# Patient Record
Sex: Male | Born: 1955 | Race: Black or African American | Hispanic: No | Marital: Married | Smoking: Current every day smoker
Health system: Southern US, Community
[De-identification: ages and names within clinical notes are randomized; demographics above are authoritative.]

## PROBLEM LIST (undated history)

## (undated) DIAGNOSIS — I10 Essential (primary) hypertension: Secondary | ICD-10-CM

## (undated) DIAGNOSIS — F1010 Alcohol abuse, uncomplicated: Secondary | ICD-10-CM

## (undated) HISTORY — DX: Essential (primary) hypertension: I10

---

## 2001-05-27 ENCOUNTER — Emergency Department (HOSPITAL_COMMUNITY): Admission: EM | Admit: 2001-05-27 | Discharge: 2001-05-27 | Payer: Self-pay | Admitting: Emergency Medicine

## 2001-05-27 ENCOUNTER — Encounter: Payer: Self-pay | Admitting: *Deleted

## 2006-04-01 ENCOUNTER — Emergency Department (HOSPITAL_COMMUNITY): Admission: EM | Admit: 2006-04-01 | Discharge: 2006-04-01 | Payer: Self-pay | Admitting: Emergency Medicine

## 2008-09-21 IMAGING — CR DG CHEST 2V
2 series · 2 of 2 positions shown · non-contrast
Comparison: none

CLINICAL DATA: Shortness of breath, and cough.
 CHEST - 2 VIEW ? 04/01/2006:

[w chest pa]
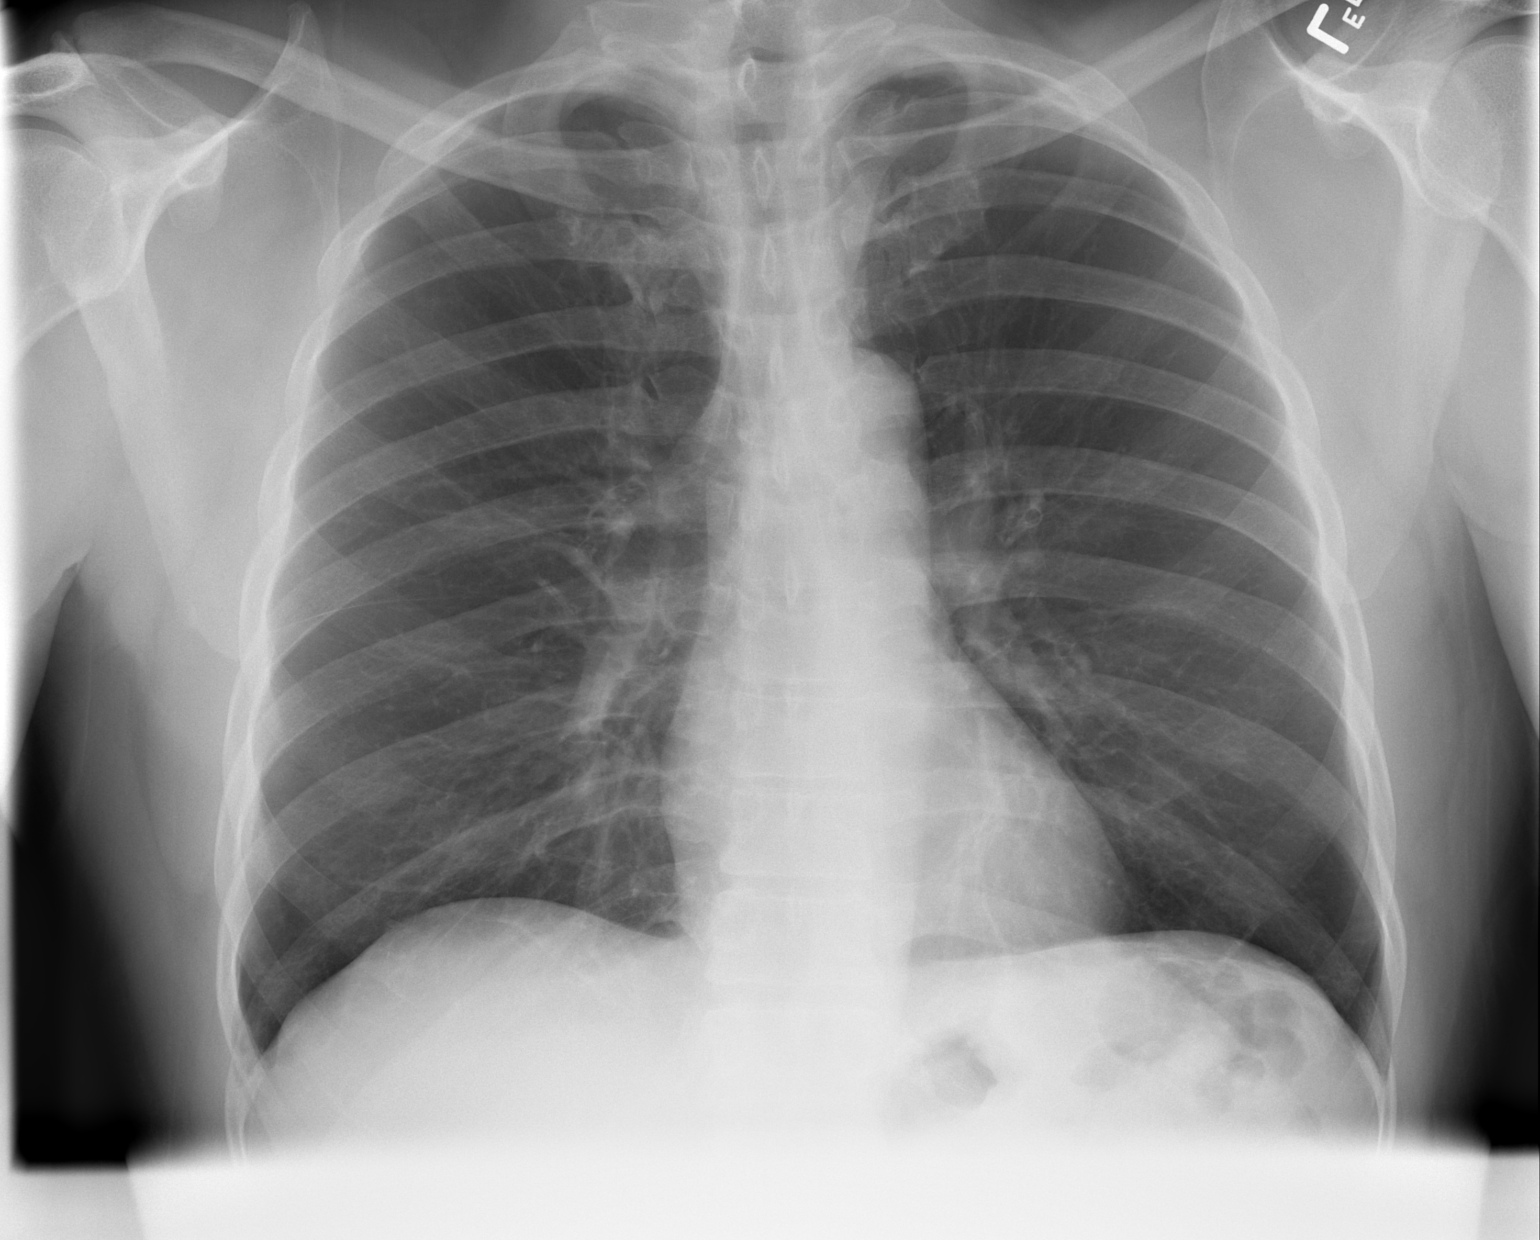

[w chest lat]
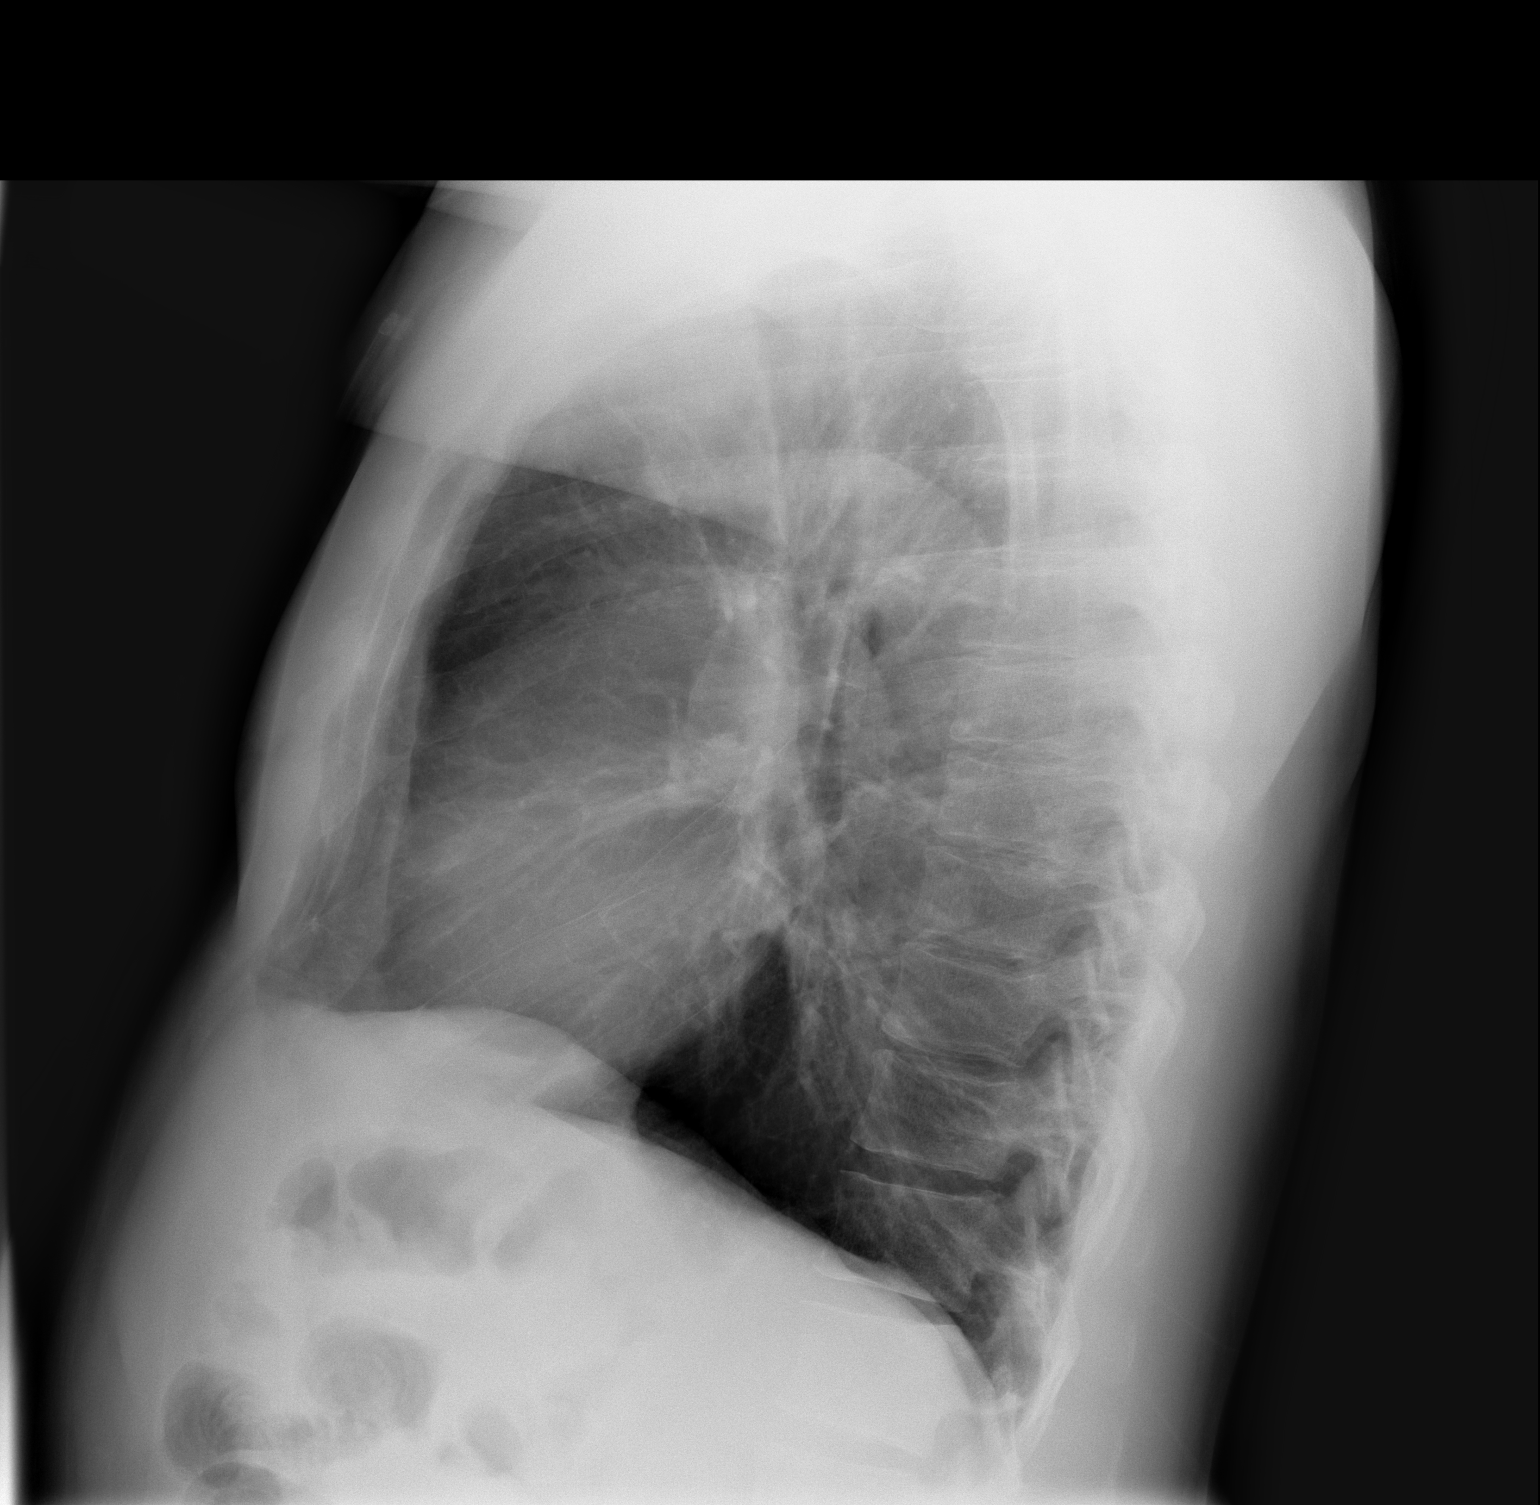

[2 of 2 positions shown; findings below may reference images not displayed]

FINDINGS: Lungs appear somewhat hyperaerated, which may be due to air trapping.  No infiltrate, consolidation or atelectasis.  Possible minimal scarring in the lingular segment.
IMPRESSION: Negative for pneumonia.

## 2009-06-27 ENCOUNTER — Inpatient Hospital Stay (HOSPITAL_COMMUNITY): Admission: EM | Admit: 2009-06-27 | Discharge: 2009-06-28 | Payer: Self-pay | Admitting: Emergency Medicine

## 2010-05-12 LAB — DIFFERENTIAL
Eosinophils Absolute: 0.1 10*3/uL (ref 0.0–0.7)
Eosinophils Relative: 1 % (ref 0–5)
Lymphocytes Relative: 10 % — ABNORMAL LOW (ref 12–46)
Lymphs Abs: 1.2 10*3/uL (ref 0.7–4.0)
Lymphs Abs: 1.4 10*3/uL (ref 0.7–4.0)
Monocytes Absolute: 0.9 10*3/uL (ref 0.1–1.0)
Monocytes Relative: 12 % (ref 3–12)
Monocytes Relative: 6 % (ref 3–12)
Neutro Abs: 11.2 10*3/uL — ABNORMAL HIGH (ref 1.7–7.7)

## 2010-05-12 LAB — CARDIAC PANEL(CRET KIN+CKTOT+MB+TROPI)
Total CK: 115 U/L (ref 7–232)
Troponin I: 0.01 ng/mL (ref 0.00–0.06)
Troponin I: 0.02 ng/mL (ref 0.00–0.06)

## 2010-05-12 LAB — URINALYSIS, ROUTINE W REFLEX MICROSCOPIC
Bilirubin Urine: NEGATIVE
Glucose, UA: NEGATIVE mg/dL
Nitrite: NEGATIVE
Specific Gravity, Urine: 1.01 (ref 1.005–1.030)
pH: 5.5 (ref 5.0–8.0)

## 2010-05-12 LAB — CBC
HCT: 37.4 % — ABNORMAL LOW (ref 39.0–52.0)
Hemoglobin: 12.9 g/dL — ABNORMAL LOW (ref 13.0–17.0)
MCHC: 34.6 g/dL (ref 30.0–36.0)
MCHC: 35.1 g/dL (ref 30.0–36.0)
MCV: 91.9 fL (ref 78.0–100.0)
Platelets: 249 10*3/uL (ref 150–400)
Platelets: 267 10*3/uL (ref 150–400)
RBC: 4.07 MIL/uL — ABNORMAL LOW (ref 4.22–5.81)
RDW: 13.4 % (ref 11.5–15.5)
RDW: 13.9 % (ref 11.5–15.5)
WBC: 13.5 10*3/uL — ABNORMAL HIGH (ref 4.0–10.5)

## 2010-05-12 LAB — CULTURE, BLOOD (ROUTINE X 2)

## 2010-05-12 LAB — BASIC METABOLIC PANEL
BUN: 11 mg/dL (ref 6–23)
CO2: 21 mEq/L (ref 19–32)
Calcium: 8.8 mg/dL (ref 8.4–10.5)
Chloride: 103 mEq/L (ref 96–112)
Creatinine, Ser: 1.29 mg/dL (ref 0.4–1.5)
GFR calc Af Amer: >60 mL/min (ref >60)
GFR calc non Af Amer: 58 mL/min — ABNORMAL LOW (ref >60)
Glucose, Bld: 117 mg/dL — ABNORMAL HIGH (ref 70–99)
Potassium: 3.8 mEq/L (ref 3.5–5.1)
Sodium: 133 mEq/L — ABNORMAL LOW (ref 135–145)

## 2010-05-12 LAB — TROPONIN I: Troponin I: <0.01 ng/mL (ref 0.00–0.06)

## 2010-05-12 LAB — COMPREHENSIVE METABOLIC PANEL
ALT: 38 U/L (ref 0–53)
AST: 45 U/L — ABNORMAL HIGH (ref 0–37)
Calcium: 8.3 mg/dL — ABNORMAL LOW (ref 8.4–10.5)
GFR calc Af Amer: >60 mL/min (ref >60)
Sodium: 136 mEq/L (ref 135–145)
Total Protein: 6.2 g/dL (ref 6.0–8.3)

## 2010-05-12 LAB — URINE MICROSCOPIC-ADD ON

## 2010-05-12 LAB — PHOSPHORUS: Phosphorus: 3.4 mg/dL (ref 2.3–4.6)

## 2010-05-12 LAB — CK TOTAL AND CKMB (NOT AT ARMC): Relative Index: 0.6 (ref 0.0–2.5)

## 2010-05-12 LAB — URINE CULTURE
Colony Count: NO GROWTH
Culture: NO GROWTH

## 2010-05-12 LAB — LIPID PANEL
Cholesterol: 122 mg/dL (ref 0–200)
HDL: 49 mg/dL (ref >39)
LDL Cholesterol: 59 mg/dL (ref 0–99)
Total CHOL/HDL Ratio: 2.5 RATIO

## 2010-05-12 LAB — HEMOGLOBIN A1C: Mean Plasma Glucose: 128 mg/dL — ABNORMAL HIGH (ref <117)

## 2011-12-18 IMAGING — CR DG CHEST 2V
2 series · 2 of 2 positions shown · non-contrast
Comparison: 04/01/2006.

CLINICAL DATA: 53-year-old male with cough, congestion, chest pain.

CHEST - 2 VIEW

[w chest pa]
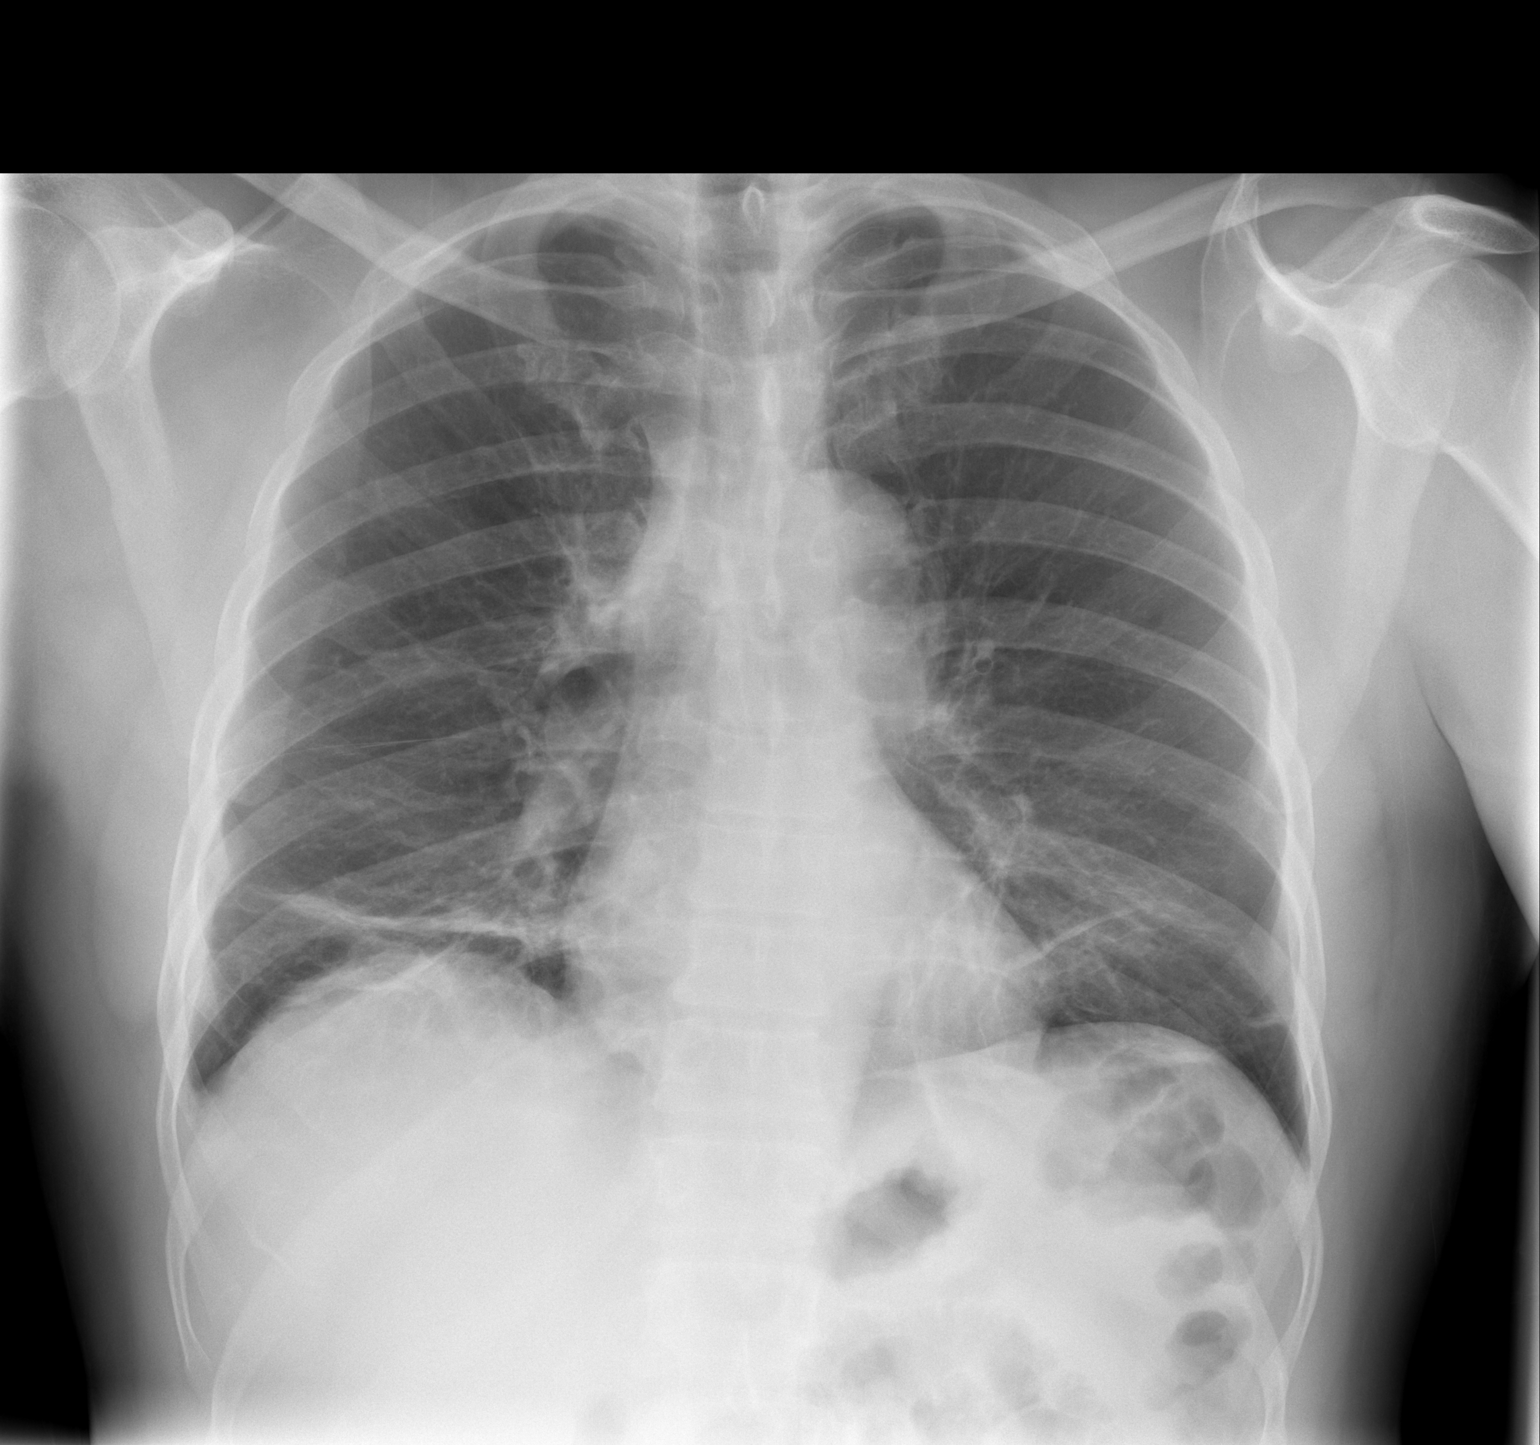

[w chest lat]
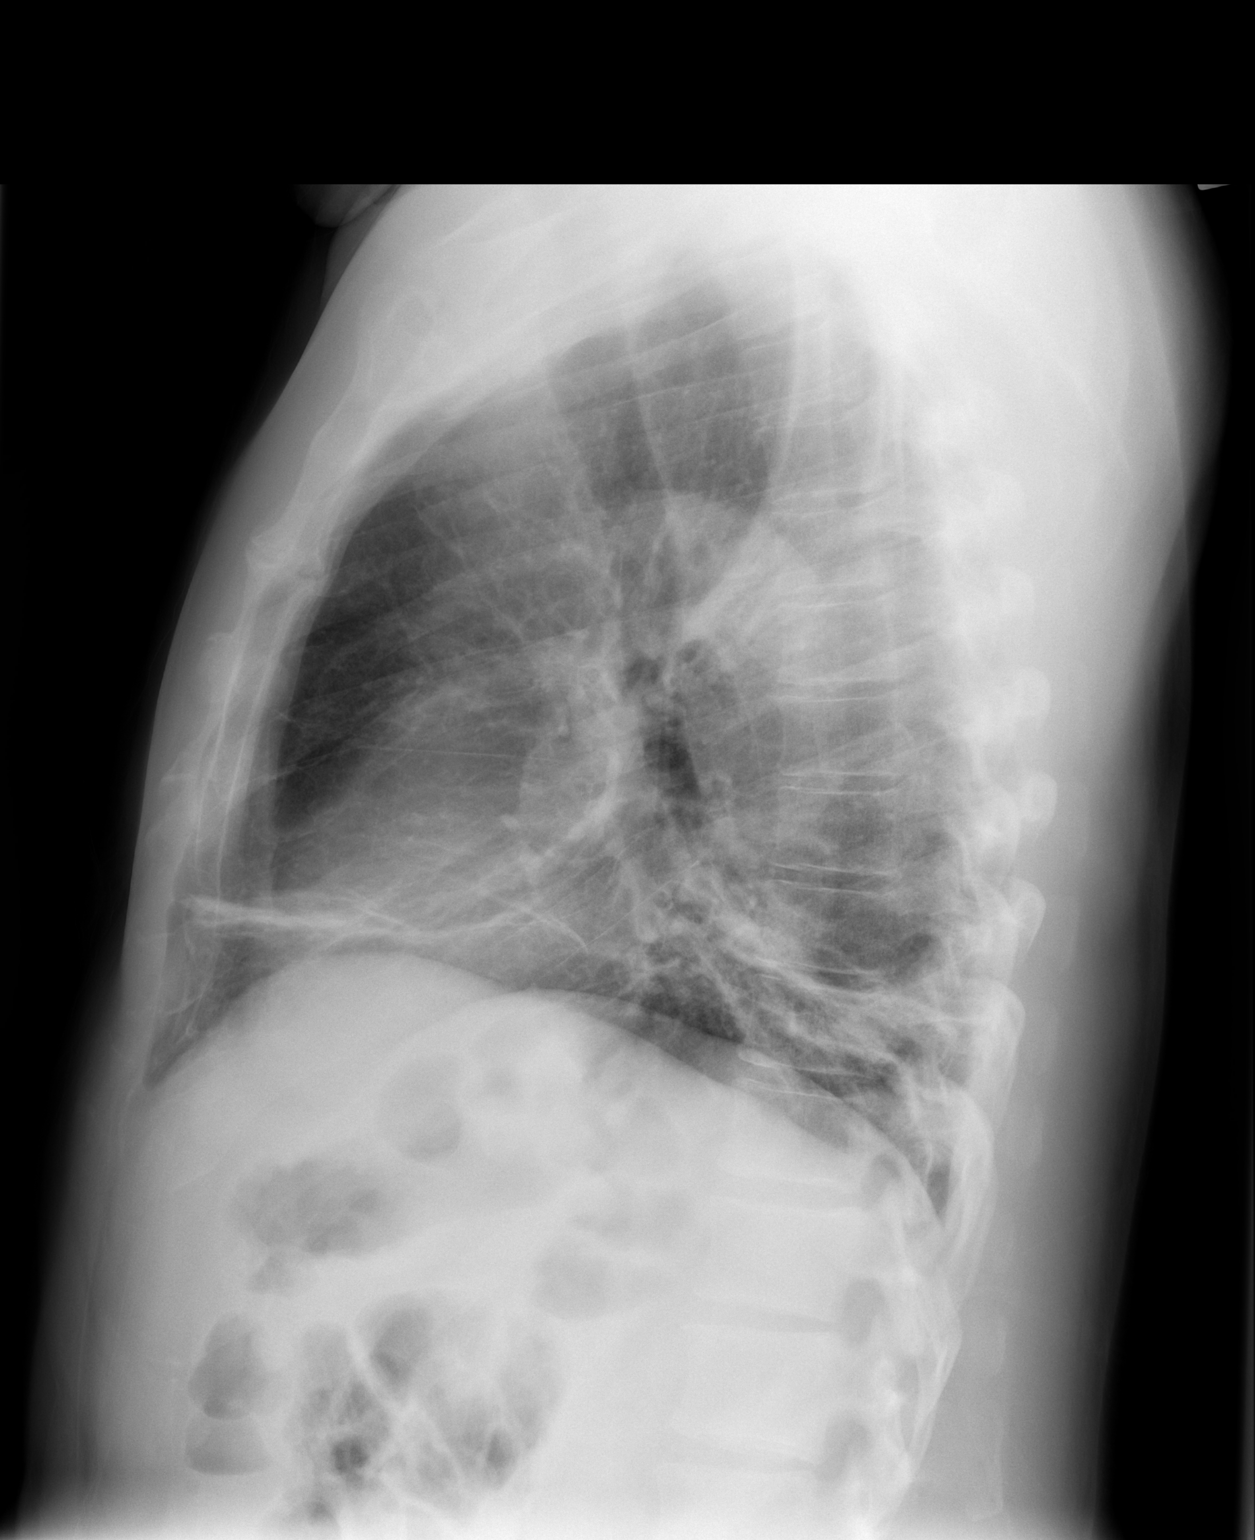

[2 of 2 positions shown; findings below may reference images not displayed]

FINDINGS: Plate-like atelectasis at both lung bases.  No pleural
effusion or consolidation.  No pneumothorax or pulmonary edema.
Cardiac size and mediastinal contours are within normal limits.
Visualized tracheal air column is within normal limits.  No acute
osseous abnormality identified.
IMPRESSION: Bibasilar opacity, primarily plate-like atelectasis.  Acute viral /
atypical infection cannot be excluded.

## 2018-10-11 NOTE — Congregational Nurse Program (Signed)
  Dept: [phone]   Congregational Nurse Program Note  Date of Encounter: 10/11/2018  Past Medical History: No past medical history on file.  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  Was in [employer].  B/P 119/87 Pulse 103 ,states he stays busy. Marko Plume [phone].

## 2019-04-24 ENCOUNTER — Ambulatory Visit: Payer: Medicare PPO | Attending: Internal Medicine

## 2019-04-24 DIAGNOSIS — Z23 Encounter for immunization: Secondary | ICD-10-CM

## 2019-04-24 NOTE — Progress Notes (Signed)
   Covid-19 Vaccination Clinic  Name:  [name]    MRN: [id number] DOB: [birth year]  04/24/2019  [name] was observed post Covid-19 immunization for 15 minutes without incident. He was provided with Vaccine Information Sheet and instruction to access the V-Safe system.   [name] was instructed to call 911 with any severe reactions post vaccine: Marland Kitchen Difficulty breathing  . Swelling of face and throat  . A fast heartbeat  . A bad rash all over body  . Dizziness and weakness   Immunizations Administered    Name Date Dose VIS Date Route   Moderna COVID-19 Vaccine 04/24/2019  3:41 PM 0.5 mL 01/23/2019 Intramuscular   Manufacturer: Moderna   Lot: [id number]   [id number]

## 2019-05-22 ENCOUNTER — Ambulatory Visit: Payer: Medicare PPO | Attending: Internal Medicine

## 2021-09-02 ENCOUNTER — Emergency Department (HOSPITAL_BASED_OUTPATIENT_CLINIC_OR_DEPARTMENT_OTHER)
Admission: EM | Admit: 2021-09-02 | Discharge: 2021-09-03 | Disposition: A | Discharge status: Home / Self Care | Payer: Medicare PPO | Attending: Emergency Medicine | Admitting: Emergency Medicine

## 2021-09-02 ENCOUNTER — Encounter (HOSPITAL_BASED_OUTPATIENT_CLINIC_OR_DEPARTMENT_OTHER): Payer: Self-pay | Admitting: Emergency Medicine

## 2021-09-02 DIAGNOSIS — N400 Enlarged prostate without lower urinary tract symptoms: Secondary | ICD-10-CM | POA: Diagnosis not present

## 2021-09-02 DIAGNOSIS — R197 Diarrhea, unspecified: Secondary | ICD-10-CM | POA: Insufficient documentation

## 2021-09-02 DIAGNOSIS — K4090 Unilateral inguinal hernia, without obstruction or gangrene, not specified as recurrent: Secondary | ICD-10-CM | POA: Insufficient documentation

## 2021-09-02 DIAGNOSIS — E860 Dehydration: Secondary | ICD-10-CM | POA: Insufficient documentation

## 2021-09-02 DIAGNOSIS — K4091 Unilateral inguinal hernia, without obstruction or gangrene, recurrent: Secondary | ICD-10-CM

## 2021-09-02 LAB — CBC WITH DIFFERENTIAL/PLATELET
Abs Immature Granulocytes: 0.02 K/uL (ref 0.00–0.07)
Basophils Absolute: 0 K/uL (ref 0.0–0.1)
Basophils Relative: 0 %
Eosinophils Absolute: 0 K/uL (ref 0.0–0.5)
Eosinophils Relative: 0 %
HCT: 44.1 % (ref 39.0–52.0)
Hemoglobin: 15.5 g/dL (ref 13.0–17.0)
Immature Granulocytes: 1 %
Lymphocytes Relative: 20 %
Lymphs Abs: 0.5 K/uL — ABNORMAL LOW (ref 0.7–4.0)
MCH: 31.6 pg (ref 26.0–34.0)
MCHC: 35.1 g/dL (ref 30.0–36.0)
MCV: 90 fL (ref 80.0–100.0)
Monocytes Absolute: 0.4 K/uL (ref 0.1–1.0)
Monocytes Relative: 15 %
Neutro Abs: 1.6 K/uL — ABNORMAL LOW (ref 1.7–7.7)
Neutrophils Relative %: 64 %
Platelets: 213 K/uL (ref 150–400)
RBC: 4.9 MIL/uL (ref 4.22–5.81)
RDW: 13.6 % (ref 11.5–15.5)
WBC: 2.5 K/uL — ABNORMAL LOW (ref 4.0–10.5)
nRBC: 0 % (ref 0.0–0.2)

## 2021-09-02 LAB — COMPREHENSIVE METABOLIC PANEL
ALT: 38 U/L (ref 0–44)
AST: 54 U/L — ABNORMAL HIGH (ref 15–41)
Albumin: 3.9 g/dL (ref 3.5–5.0)
Alkaline Phosphatase: 73 U/L (ref 38–126)
Anion gap: 12 (ref 5–15)
BUN: 20 mg/dL (ref 8–23)
CO2: 17 mmol/L — ABNORMAL LOW (ref 22–32)
Calcium: 8.5 mg/dL — ABNORMAL LOW (ref 8.9–10.3)
Chloride: 103 mmol/L (ref 98–111)
Creatinine, Ser: 1.57 mg/dL — ABNORMAL HIGH (ref 0.61–1.24)
GFR, Estimated: 48 mL/min — ABNORMAL LOW (ref >60)
Glucose, Bld: 110 mg/dL — ABNORMAL HIGH (ref 70–99)
Potassium: 4.1 mmol/L (ref 3.5–5.1)
Sodium: 132 mmol/L — ABNORMAL LOW (ref 135–145)
Total Bilirubin: 0.6 mg/dL (ref 0.3–1.2)
Total Protein: 8 g/dL (ref 6.5–8.1)

## 2021-09-02 LAB — LIPASE, BLOOD: Lipase: 96 U/L — ABNORMAL HIGH (ref 11–51)

## 2021-09-02 MED ORDER — LORAZEPAM 2 MG/ML IJ SOLN
1.0000 mg | Freq: Once | INTRAMUSCULAR | Status: AC
  Administered 2021-09-02: 1 mg via INTRAVENOUS
  Filled 2021-09-02: qty 1

## 2021-09-02 MED ORDER — SODIUM CHLORIDE 0.9 % IV BOLUS
1000.0000 mL | Freq: Once | INTRAVENOUS | Status: AC
  Administered 2021-09-02: 1000 mL via INTRAVENOUS

## 2021-09-02 MED ORDER — ONDANSETRON HCL 4 MG/2ML IJ SOLN
4.0000 mg | Freq: Once | INTRAMUSCULAR | Status: AC
  Administered 2021-09-02: 4 mg via INTRAVENOUS
  Filled 2021-09-02: qty 2

## 2021-09-02 NOTE — ED Triage Notes (Addendum)
Pt c/o diarrhea, nausea and generalized abdominal pain x 3 days. Denies vomiting. States he drinks 1/2 a 5th of liquor per day. Last drink 2 days ago. CIWA 8  Also c/o right groin mass x 10 years.

## 2021-09-03 ENCOUNTER — Emergency Department (HOSPITAL_BASED_OUTPATIENT_CLINIC_OR_DEPARTMENT_OTHER): Payer: Medicare PPO

## 2021-09-03 LAB — URINALYSIS, MICROSCOPIC (REFLEX)

## 2021-09-03 LAB — URINALYSIS, ROUTINE W REFLEX MICROSCOPIC
Bilirubin Urine: NEGATIVE
Glucose, UA: NEGATIVE mg/dL
Ketones, ur: NEGATIVE mg/dL
Leukocytes,Ua: NEGATIVE
Nitrite: NEGATIVE
Protein, ur: 30 mg/dL — AB
Specific Gravity, Urine: 1.025 (ref 1.005–1.030)
pH: 5 (ref 5.0–8.0)

## 2021-09-03 MED ORDER — LACTATED RINGERS IV BOLUS
1000.0000 mL | Freq: Once | INTRAVENOUS | Status: AC
  Administered 2021-09-03: 1000 mL via INTRAVENOUS

## 2021-09-03 MED ORDER — IOHEXOL 300 MG/ML  SOLN
100.0000 mL | Freq: Once | INTRAMUSCULAR | Status: AC | PRN
  Administered 2021-09-03: 100 mL via INTRAVENOUS

## 2021-09-03 NOTE — Discharge Instructions (Addendum)
Please follow-up with a primary care doctor as soon as possible.  You will need to have a full evaluation of your prostate.  You would also benefit from health screening for your age.  You may also need further testing of your liver  you will also need to cut back on your alcohol intake

## 2021-09-03 NOTE — ED Provider Notes (Signed)
[facility] Provider Note   CSN: [id number] Arrival date & time: 09/02/21  2117     History  Chief Complaint  Patient presents with   Diarrhea   Mass    Right Groin    Gerald Mckinney is a 66 y.o. male.  The history is provided by the patient and a relative.  Diarrhea Quality:  Watery Severity:  Severe Onset quality:  Gradual Duration:  3 days Timing:  Intermittent Progression:  Worsening Relieved by:  Nothing Associated symptoms: abdominal pain and chills   Patient with history of alcohol use disorder presents with nausea, diarrhea and abdominal pain.  Patient reports for the past 3 days he had very little oral intake.  No vomiting but reports nausea.  Reports nonbloody diarrhea.  He reports chills. Reports he drinks liquor about every day, last drink over 2 days ago Patient also reports he has a "mass "in his right groin he has had for a decade    Home Medications Prior to Admission medications   Not on File      Allergies    Patient has no known allergies.    Review of Systems   Review of Systems  Constitutional:  Positive for chills.  Gastrointestinal:  Positive for abdominal pain and diarrhea.    Physical Exam Updated Vital Signs BP 112/80   Pulse 85   Temp 98.9 F (37.2 C) (Oral)   Resp 19   SpO2 98%  Physical Exam CONSTITUTIONAL: Disheveled, no acute distress HEAD: Normocephalic/atraumatic EYES: EOMI/PERRL ENMT: Mucous membranes dry, poor dentition NECK: supple no meningeal signs SPINE/BACK:entire spine nontender CV: S1/S2 noted, no murmurs/rubs/gallops noted LUNGS: Lungs are clear to auscultation bilaterally, no apparent distress ABDOMEN: soft, mild diffuse tenderness, no rebound or guarding, bowel sounds noted throughout abdomen, right inguinal hernia is noted without discoloration or erythema, mild tenderness noted GU:no cva tenderness NEURO: Pt is awake/alert/appropriate, moves all extremitiesx4.  No facial  droop.   EXTREMITIES: pulses normal/equal, full ROM SKIN: warm, color normal PSYCH: no abnormalities of mood noted, alert and oriented to situation  ED Results / Procedures / Treatments   Labs (all labs ordered are listed, but only abnormal results are displayed) Labs Reviewed  LIPASE, BLOOD - Abnormal; Notable for the following components:      Result Value   Lipase 96 (*)    All other components within normal limits  COMPREHENSIVE METABOLIC PANEL - Abnormal; Notable for the following components:   Sodium 132 (*)    CO2 17 (*)    Glucose, Bld 110 (*)    Creatinine, Ser 1.57 (*)    Calcium 8.5 (*)    AST 54 (*)    GFR, Estimated 48 (*)    All other components within normal limits  URINALYSIS, ROUTINE W REFLEX MICROSCOPIC - Abnormal; Notable for the following components:   Hgb urine dipstick TRACE (*)    Protein, ur 30 (*)    All other components within normal limits  CBC WITH DIFFERENTIAL/PLATELET - Abnormal; Notable for the following components:   WBC 2.5 (*)    Neutro Abs 1.6 (*)    Lymphs Abs 0.5 (*)    All other components within normal limits  URINALYSIS, MICROSCOPIC (REFLEX) - Abnormal; Notable for the following components:   Bacteria, UA RARE (*)    All other components within normal limits    EKG EKG Interpretation  Date/Time:  Wednesday September 02 2021 23:30:10 EDT Ventricular Rate:  92 PR Interval:  134  QRS Duration: 82 QT Interval:  326 QTC Calculation: 404 R Axis:   59 Text Interpretation: Sinus rhythm Consider right atrial enlargement Abnormal R-wave progression, early transition Left ventricular hypertrophy No previous ECGs available Confirmed by Ripley Fraise [phone]) on 09/02/2021 11:32:47 PM  Radiology CT ABDOMEN PELVIS W CONTRAST  Result Date: 09/03/2021 CLINICAL DATA:  Abdominal pain, acute, nonlocalized. Diarrhea, nausea, generalized abdominal pain. EXAM: CT ABDOMEN AND PELVIS WITH CONTRAST TECHNIQUE: Multidetector CT imaging of the abdomen and  pelvis was performed using the standard protocol following bolus administration of intravenous contrast. RADIATION DOSE REDUCTION: This exam was performed according to the departmental dose-optimization program which includes automated exposure control, adjustment of the mA and/or kV according to patient size and/or use of iterative reconstruction technique. CONTRAST:  110m OMNIPAQUE IOHEXOL 300 MG/ML  SOLN COMPARISON:  None Available. FINDINGS: Lower chest: No acute abnormality. Hepatobiliary: Possible 15 mm hemangioma within the left hepatic lobe, not optimally characterized on this single-phase examination. Mild hepatic steatosis. No intra or extrahepatic biliary ductal dilation. Gallbladder unremarkable. Pancreas: Unremarkable Spleen: Unremarkable Adrenals/Urinary Tract: Adrenal glands are unremarkable. Kidneys are normal, without renal calculi, focal lesion, or hydronephrosis. Bladder is unremarkable. Stomach/Bowel: Large direct right inguinal hernia contains the terminal ileum, ileocecal junction, and cecum as well as the appendix. There is no evidence of obstruction or focal inflammation. The stomach, small bowel, and large bowel are otherwise unremarkable. No free intraperitoneal gas or fluid. Vascular/Lymphatic: Extensive aortoiliac atherosclerotic calcification. No aortic aneurysm. No pathologic adenopathy within the abdomen and pelvis. Reproductive: Mild prostatic hypertrophy. 18 mm rim enhancing nodule noted within the right lobe of the prostate is indeterminate, not well characterized on this examination. Seminal vesicles are unremarkable. Other: Broad-based umbilical hernia contains a loop of unremarkable small bowel. Small fat containing direct left inguinal hernia is present. Musculoskeletal: No acute bone abnormality. Inferior endplate fractures of L3 and L4 appear remote in nature. No lytic or blastic bone lesions are identified. IMPRESSION: 1. No acute intra-abdominal pathology identified. No  definite radiographic explanation for the patient's reported symptoms. 2. Large direct right inguinal hernia containing the terminal ileum, ileocecal junction, cecum as well as the appendix. No evidence of obstruction or focal inflammation. 3. Broad-based umbilical hernia containing a loop of unremarkable small bowel. Small fat containing direct left inguinal hernia. 4. 18 mm rim enhancing nodule within the right lobe of the prostate, not well characterized on this examination. Correlation with serum PSA level and physical examination may be helpful for further evaluation. 5. Mild hepatic steatosis. 6. 15 mm probable hemangioma within the left hepatic lobe. This could be confirmed with MRI examination if indicated. 7.  Aortic Atherosclerosis (ICD10-I70.0). Electronically Signed   By: AFidela SalisburyM.D.   On: 09/03/2021 00:50    Procedures Procedures    Medications Ordered in ED Medications  sodium chloride 0.9 % bolus 1,000 mL (0 mLs Intravenous Stopped 09/03/21 0148)  ondansetron (ZOFRAN) injection 4 mg (4 mg Intravenous Given 09/02/21 2336)  LORazepam (ATIVAN) injection 1 mg (1 mg Intravenous Given 09/02/21 2336)  lactated ringers bolus 1,000 mL (1,000 mLs Intravenous New Bag/Given 09/03/21 0147)  iohexol (OMNIPAQUE) 300 MG/ML solution 100 mL (100 mLs Intravenous Contrast Given 09/03/21 0030)    ED Course/ Medical Decision Making/ A&P Clinical Course as of 09/03/21 0319  Wed Sep 02, 2021  2331 Creatinine(!): 1.57 Renal insufficiency [DW]  2332 CO2(!): 17 Dehydration [DW]  2332 WBC(!): 2.5 Leukopenia [DW]  Thu Sep 03, 2021  0229 Patient did have an episode of  diarrhea, but overall feeling improved.  CT imaging revealed multiple findings but no acute surgical needs.  He does have hernias that will require outpatient follow-up.  Patient reports has had his right-sided hernia for up to 10 years.  It is soft without significant tenderness.  Will refer to general surgery [DW]  0231 Discussed the  need for patient to cut back on alcohol use.  He reports he is already trying.  Also stressed the importance of PCP evaluation due to his age.  Advised that his prostate appears enlarged on CT and will need to have this evaluated [DW]  0318 Patient feels improved.  Labs are appropriate.  With patient's permission, I discussed at length with his sister about all the findings.  Stressed the importance of primary care follow-up for full evaluation including prostate as he is at risk for prostate cancer.  We will also need follow-up for liver findings but this could be due to his alcohol use.  He will also be referred to general surgery for his chronic hernias [DW]    Clinical Course User Index [DW] Ripley Fraise, MD                           Medical Decision Making Amount and/or Complexity of Data Reviewed Labs: ordered. Decision-making details documented in ED Course. Radiology: ordered. ECG/medicine tests: ordered.  Risk Prescription drug management.   This patient presents to the ED for concern of abdominal pain and diarrhea, this involves an extensive number of treatment options, and is a complaint that carries with it a high risk of complications and morbidity.  The differential diagnosis includes but is not limited to cholecystitis, cholelithiasis, pancreatitis, gastritis, peptic ulcer disease, appendicitis, bowel obstruction, bowel perforation, diverticulitis, AAA, ischemic bowel   Comorbidities that complicate the patient evaluation: Patient's presentation is complicated by their history of alcohol use disorder  Social Determinants of Health: Patient's impaired access to primary care and alcohol use    increases the complexity of managing their presentation  Additional history obtained: Additional history obtained from family   Lab Tests: I Ordered, and personally interpreted labs.  The pertinent results include: Renal insufficiency  Imaging Studies ordered: I ordered  imaging studies including CT scan abdomen pelvis   I independently visualized and interpreted imaging which showed hernia without obstruction I agree with the radiologist interpretation  Cardiac Monitoring: The patient was maintained on a cardiac monitor.  I personally viewed and interpreted the cardiac monitor which showed an underlying rhythm of:  sinus rhythm  Medicines ordered and prescription drug management: I ordered medication including Zofran, IV fluids, Ativan for nausea and alcohol withdrawal Reevaluation of the patient after these medicines showed that the patient    improved   Critical Interventions:  IV fluids    Reevaluation: After the interventions noted above, I reevaluated the patient and found that they have :improved  Complexity of problems addressed: Patient's presentation is most consistent with  acute presentation with potential threat to life or bodily function  Disposition: After consideration of the diagnostic results and the patient's response to treatment,  I feel that the patent would benefit from discharge   .           Final Clinical Impression(s) / ED Diagnoses Final diagnoses:  Diarrhea, unspecified type  Dehydration  Unilateral recurrent inguinal hernia without obstruction or gangrene  Prostate enlargement    Rx / DC Orders ED Discharge Orders     None  Ripley Fraise, MD 09/03/21 [phone]

## 2021-09-04 ENCOUNTER — Other Ambulatory Visit: Payer: Self-pay

## 2021-09-04 ENCOUNTER — Encounter (HOSPITAL_BASED_OUTPATIENT_CLINIC_OR_DEPARTMENT_OTHER): Payer: Self-pay

## 2021-09-04 ENCOUNTER — Emergency Department (HOSPITAL_BASED_OUTPATIENT_CLINIC_OR_DEPARTMENT_OTHER): Payer: Medicare PPO

## 2021-09-04 ENCOUNTER — Emergency Department (HOSPITAL_BASED_OUTPATIENT_CLINIC_OR_DEPARTMENT_OTHER)
Admission: EM | Admit: 2021-09-04 | Discharge: 2021-09-04 | Disposition: A | Discharge status: Home / Self Care | Payer: Medicare PPO | Attending: Emergency Medicine | Admitting: Emergency Medicine

## 2021-09-04 DIAGNOSIS — Z20822 Contact with and (suspected) exposure to covid-19: Secondary | ICD-10-CM | POA: Diagnosis not present

## 2021-09-04 DIAGNOSIS — I700 Atherosclerosis of aorta: Secondary | ICD-10-CM | POA: Diagnosis not present

## 2021-09-04 DIAGNOSIS — R531 Weakness: Secondary | ICD-10-CM | POA: Diagnosis not present

## 2021-09-04 DIAGNOSIS — R509 Fever, unspecified: Secondary | ICD-10-CM | POA: Diagnosis not present

## 2021-09-04 DIAGNOSIS — F17210 Nicotine dependence, cigarettes, uncomplicated: Secondary | ICD-10-CM | POA: Diagnosis not present

## 2021-09-04 DIAGNOSIS — E860 Dehydration: Secondary | ICD-10-CM | POA: Diagnosis not present

## 2021-09-04 DIAGNOSIS — R197 Diarrhea, unspecified: Secondary | ICD-10-CM

## 2021-09-04 HISTORY — DX: Alcohol abuse, uncomplicated: F10.10

## 2021-09-04 LAB — COMPREHENSIVE METABOLIC PANEL
ALT: 42 U/L (ref 0–44)
AST: 71 U/L — ABNORMAL HIGH (ref 15–41)
Albumin: 3.5 g/dL (ref 3.5–5.0)
Alkaline Phosphatase: 60 U/L (ref 38–126)
Anion gap: 9 (ref 5–15)
BUN: 13 mg/dL (ref 8–23)
CO2: 16 mmol/L — ABNORMAL LOW (ref 22–32)
Calcium: 7.9 mg/dL — ABNORMAL LOW (ref 8.9–10.3)
Chloride: 106 mmol/L (ref 98–111)
Creatinine, Ser: 1.15 mg/dL (ref 0.61–1.24)
GFR, Estimated: >60 mL/min (ref >60)
Glucose, Bld: 100 mg/dL — ABNORMAL HIGH (ref 70–99)
Potassium: 4.4 mmol/L (ref 3.5–5.1)
Sodium: 131 mmol/L — ABNORMAL LOW (ref 135–145)
Total Bilirubin: 0.6 mg/dL (ref 0.3–1.2)
Total Protein: 7.2 g/dL (ref 6.5–8.1)

## 2021-09-04 LAB — CBC
HCT: 41.3 % (ref 39.0–52.0)
Hemoglobin: 14.2 g/dL (ref 13.0–17.0)
MCH: 31.6 pg (ref 26.0–34.0)
MCHC: 34.4 g/dL (ref 30.0–36.0)
MCV: 91.8 fL (ref 80.0–100.0)
Platelets: 122 10*3/uL — ABNORMAL LOW (ref 150–400)
RBC: 4.5 MIL/uL (ref 4.22–5.81)
RDW: 13.7 % (ref 11.5–15.5)
WBC: 1.8 10*3/uL — ABNORMAL LOW (ref 4.0–10.5)
nRBC: 0 % (ref 0.0–0.2)

## 2021-09-04 LAB — DIFFERENTIAL
Abs Immature Granulocytes: 0.03 10*3/uL (ref 0.00–0.07)
Basophils Absolute: 0 10*3/uL (ref 0.0–0.1)
Basophils Relative: 2 %
Eosinophils Absolute: 0 10*3/uL (ref 0.0–0.5)
Eosinophils Relative: 1 %
Immature Granulocytes: 2 %
Lymphocytes Relative: 33 %
Lymphs Abs: 0.6 10*3/uL — ABNORMAL LOW (ref 0.7–4.0)
Monocytes Absolute: 0.1 10*3/uL (ref 0.1–1.0)
Monocytes Relative: 6 %
Neutro Abs: 1 10*3/uL — ABNORMAL LOW (ref 1.7–7.7)
Neutrophils Relative %: 56 %
Smear Review: NORMAL

## 2021-09-04 LAB — LIPASE, BLOOD: Lipase: 127 U/L — ABNORMAL HIGH (ref 11–51)

## 2021-09-04 LAB — SARS CORONAVIRUS 2 BY RT PCR: SARS Coronavirus 2 by RT PCR: NEGATIVE

## 2021-09-04 MED ORDER — KETOROLAC TROMETHAMINE 15 MG/ML IJ SOLN
7.5000 mg | Freq: Once | INTRAMUSCULAR | Status: AC
  Administered 2021-09-04: 7.5 mg via INTRAVENOUS
  Filled 2021-09-04: qty 1

## 2021-09-04 MED ORDER — LACTATED RINGERS IV BOLUS
1000.0000 mL | Freq: Once | INTRAVENOUS | Status: AC
  Administered 2021-09-04: 1000 mL via INTRAVENOUS

## 2021-09-04 MED ORDER — LOPERAMIDE HCL 2 MG PO CAPS: 2.0000 mg | ORAL_CAPSULE | Freq: Four times a day (QID) | ORAL | 0 refills | Status: AC | PRN

## 2021-09-04 MED ORDER — LOPERAMIDE HCL 2 MG PO CAPS
4.0000 mg | ORAL_CAPSULE | Freq: Once | ORAL | Status: AC
  Administered 2021-09-04: 4 mg via ORAL
  Filled 2021-09-04: qty 2

## 2021-09-04 MED ORDER — ONDANSETRON HCL 4 MG/2ML IJ SOLN
4.0000 mg | Freq: Once | INTRAMUSCULAR | Status: AC
  Administered 2021-09-04: 4 mg via INTRAVENOUS
  Filled 2021-09-04: qty 2

## 2021-09-04 MED ORDER — ONDANSETRON 8 MG PO TBDP: 8.0000 mg | ORAL_TABLET | Freq: Three times a day (TID) | ORAL | 0 refills | Status: AC | PRN

## 2021-09-04 NOTE — ED Triage Notes (Signed)
Pt reports chills, insomnia, and nausea x3 weeks. Pt reports ETOH abuse. Last drink 4 days ago.

## 2021-09-04 NOTE — ED Provider Notes (Signed)
[facility] DEPT [facility] Provider Note: Gerald Spurling, MD, FACEP  CSN: [id number] MRN: [id number] ARRIVAL: 09/04/21 at [place]   HISTORY OF PRESENT ILLNESS  09/04/21 3:15 AM Gerald Mckinney is a 66 y.o. male who was seen here yesterday morning for diarrhea that has been present for 3 days along with abdominal pain, nausea and chills.  He had an extensive work-up including laboratory studies and a CT scan.  He was rehydrated and discharged.  He rates his pain as an 8 out of 10.  He has a history of alcohol abuse and has not had alcohol in 4 days.  He returns with additional chills, malaise, generalized weakness, shortness of breath and chest pain (central) with deep breathing or coughing.  He was noted to have a low-grade fever on arrival.   Past Medical History:  Diagnosis Date   Alcohol abuse     History reviewed. No pertinent surgical history.  History reviewed. No pertinent family history.  Social History   Tobacco Use   Smoking status: Every Day    Packs/day: 1.00    Types: Cigarettes   Smokeless tobacco: Former  Substance Use Topics   Alcohol use: Yes    Comment: 1/2 5th per day   Drug use: Never    Prior to Admission medications   Medication Sig Start Date End Date Taking? Authorizing Provider  loperamide (IMODIUM) 2 MG capsule Take 1 capsule (2 mg total) by mouth 4 (four) times daily as needed for diarrhea or loose stools. 09/04/21  Yes Toure Edmonds, Jenny Reichmann, MD  ondansetron (ZOFRAN-ODT) 8 MG disintegrating tablet Take 1 tablet (8 mg total) by mouth every 8 (eight) hours as needed for nausea or vomiting. 09/04/21  Yes Daizha Anand, MD    Allergies Patient has no known allergies.   REVIEW OF SYSTEMS  Negative except as noted here or in the History of Present Illness.   PHYSICAL EXAMINATION  Initial Vital Signs Blood pressure (!) 158/68, pulse 91, temperature 100.1 F (37.8 C), temperature source Oral, resp. rate 20, height 5'  7" (1.702 m), weight 79.4 kg, SpO2 98 %.  Examination General: Well-developed, well-nourished male in no acute distress; appearance consistent with age of record HENT: normocephalic; atraumatic Eyes: Normal appearance Neck: supple Heart: regular rate and rhythm Lungs: clear to auscultation bilaterally Abdomen: soft; nondistended; nontender; bowel sounds present Extremities: No deformity; full range of motion; pulses normal Neurologic: Awake, alert and oriented; motor function intact in all extremities and symmetric; no facial droop Skin: Warm and dry Psychiatric: Normal mood and affect   RESULTS  Summary of this visit's results, reviewed and interpreted by myself:   EKG Interpretation  Date/Time:  Friday September 04 2021 03:34:43 EDT Ventricular Rate:  80 PR Interval:  137 QRS Duration: 73 QT Interval:  329 QTC Calculation: 380 R Axis:   47 Text Interpretation: Sinus rhythm Consider left atrial enlargement Abnormal R-wave progression, early transition Consider left ventricular hypertrophy No significant change was found Confirmed by Ryelee Albee [phone]) on 09/04/2021 3:46:43 AM       Laboratory Studies: Results for orders placed or performed during the hospital encounter of 09/04/21 (from the past 24 hour(s))  Lipase, blood     Status: Abnormal   Collection Time: 09/04/21 12:26 AM  Result Value Ref Range   Lipase 127 (H) 11 - 51 U/L  Comprehensive metabolic panel     Status: Abnormal   Collection Time: 09/04/21 12:26 AM  Result Value  Ref Range   Sodium 131 (L) 135 - 145 mmol/L   Potassium 4.4 3.5 - 5.1 mmol/L   Chloride 106 98 - 111 mmol/L   CO2 16 (L) 22 - 32 mmol/L   Glucose, Bld 100 (H) 70 - 99 mg/dL   BUN 13 8 - 23 mg/dL   Creatinine, Ser 1.15 0.61 - 1.24 mg/dL   Calcium 7.9 (L) 8.9 - 10.3 mg/dL   Total Protein 7.2 6.5 - 8.1 g/dL   Albumin 3.5 3.5 - 5.0 g/dL   AST 71 (H) 15 - 41 U/L   ALT 42 0 - 44 U/L   Alkaline Phosphatase 60 38 - 126 U/L   Total Bilirubin 0.6  0.3 - 1.2 mg/dL   GFR, Estimated >60 >60 mL/min   Anion gap 9 5 - 15  CBC     Status: Abnormal   Collection Time: 09/04/21 12:26 AM  Result Value Ref Range   WBC 1.8 (L) 4.0 - 10.5 K/uL   RBC 4.50 4.22 - 5.81 MIL/uL   Hemoglobin 14.2 13.0 - 17.0 g/dL   HCT 41.3 39.0 - 52.0 %   MCV 91.8 80.0 - 100.0 fL   MCH 31.6 26.0 - 34.0 pg   MCHC 34.4 30.0 - 36.0 g/dL   RDW 13.7 11.5 - 15.5 %   Platelets 122 (L) 150 - 400 K/uL   nRBC 0.0 0.0 - 0.2 %  Differential     Status: Abnormal   Collection Time: 09/04/21 12:26 AM  Result Value Ref Range   Neutrophils Relative % 56 %   Neutro Abs 1.0 (L) 1.7 - 7.7 K/uL   Lymphocytes Relative 33 %   Lymphs Abs 0.6 (L) 0.7 - 4.0 K/uL   Monocytes Relative 6 %   Monocytes Absolute 0.1 0.1 - 1.0 K/uL   Eosinophils Relative 1 %   Eosinophils Absolute 0.0 0.0 - 0.5 K/uL   Basophils Relative 2 %   Basophils Absolute 0.0 0.0 - 0.1 K/uL   WBC Morphology VACUOLATED NEUTROPHILS    RBC Morphology MORPHOLOGY UNREMARKABLE    Smear Review Normal platelet morphology    Immature Granulocytes 2 %   Abs Immature Granulocytes 0.03 0.00 - 0.07 K/uL  SARS Coronavirus 2 by RT PCR (hospital order, performed in [facility] hospital lab) *cepheid single result test* Anterior Nasal Swab     Status: None   Collection Time: 09/04/21  3:38 AM   Specimen: Anterior Nasal Swab  Result Value Ref Range   SARS Coronavirus 2 by RT PCR NEGATIVE NEGATIVE   Imaging Studies: DG Chest 2 View  Result Date: 09/04/2021 CLINICAL DATA:  66 year old male with shortness of breath. EXAM: CHEST - 2 VIEW COMPARISON:  CT Abdomen and Pelvis yesterday. Chest radiographs 06/27/2009. FINDINGS: PA and lateral views at 0347 hours. Normal lung volumes and mediastinal contours. Visualized tracheal air column is within normal limits. Both lungs appear clear. No pneumothorax or pleural effusion. No acute osseous abnormality identified. Stable visible bowel gas pattern. IMPRESSION: No acute cardiopulmonary  abnormality. Electronically Signed   By: Genevie Ann M.D.   On: 09/04/2021 04:16   CT ABDOMEN PELVIS W CONTRAST  Result Date: 09/03/2021 CLINICAL DATA:  Abdominal pain, acute, nonlocalized. Diarrhea, nausea, generalized abdominal pain. EXAM: CT ABDOMEN AND PELVIS WITH CONTRAST TECHNIQUE: Multidetector CT imaging of the abdomen and pelvis was performed using the standard protocol following bolus administration of intravenous contrast. RADIATION DOSE REDUCTION: This exam was performed according to the departmental dose-optimization program which includes automated exposure  control, adjustment of the mA and/or kV according to patient size and/or use of iterative reconstruction technique. CONTRAST:  12m OMNIPAQUE IOHEXOL 300 MG/ML  SOLN COMPARISON:  None Available. FINDINGS: Lower chest: No acute abnormality. Hepatobiliary: Possible 15 mm hemangioma within the left hepatic lobe, not optimally characterized on this single-phase examination. Mild hepatic steatosis. No intra or extrahepatic biliary ductal dilation. Gallbladder unremarkable. Pancreas: Unremarkable Spleen: Unremarkable Adrenals/Urinary Tract: Adrenal glands are unremarkable. Kidneys are normal, without renal calculi, focal lesion, or hydronephrosis. Bladder is unremarkable. Stomach/Bowel: Large direct right inguinal hernia contains the terminal ileum, ileocecal junction, and cecum as well as the appendix. There is no evidence of obstruction or focal inflammation. The stomach, small bowel, and large bowel are otherwise unremarkable. No free intraperitoneal gas or fluid. Vascular/Lymphatic: Extensive aortoiliac atherosclerotic calcification. No aortic aneurysm. No pathologic adenopathy within the abdomen and pelvis. Reproductive: Mild prostatic hypertrophy. 18 mm rim enhancing nodule noted within the right lobe of the prostate is indeterminate, not well characterized on this examination. Seminal vesicles are unremarkable. Other: Broad-based umbilical hernia  contains a loop of unremarkable small bowel. Small fat containing direct left inguinal hernia is present. Musculoskeletal: No acute bone abnormality. Inferior endplate fractures of L3 and L4 appear remote in nature. No lytic or blastic bone lesions are identified. IMPRESSION: 1. No acute intra-abdominal pathology identified. No definite radiographic explanation for the patient's reported symptoms. 2. Large direct right inguinal hernia containing the terminal ileum, ileocecal junction, cecum as well as the appendix. No evidence of obstruction or focal inflammation. 3. Broad-based umbilical hernia containing a loop of unremarkable small bowel. Small fat containing direct left inguinal hernia. 4. 18 mm rim enhancing nodule within the right lobe of the prostate, not well characterized on this examination. Correlation with serum PSA level and physical examination may be helpful for further evaluation. 5. Mild hepatic steatosis. 6. 15 mm probable hemangioma within the left hepatic lobe. This could be confirmed with MRI examination if indicated. 7.  Aortic Atherosclerosis (ICD10-I70.0). Electronically Signed   By: AFidela SalisburyM.D.   On: 09/03/2021 00:50    ED COURSE and MDM  Nursing notes, initial and subsequent vitals signs, including pulse oximetry, reviewed and interpreted by myself.  Vitals:   09/04/21 0019 09/04/21 0019 09/04/21 0322  BP:  (!) 158/68 (!) 155/71  Pulse:  91 94  Resp:  20 20  Temp:  100.1 F (37.8 C) 99.9 F (37.7 C)  TempSrc:  Oral Oral  SpO2:  98% 99%  Weight: 79.4 kg    Height: '5\' 7"'$  (1.702 m)     Medications  lactated ringers bolus 1,000 mL (1,000 mLs Intravenous New Bag/Given 09/04/21 0516)  ondansetron (ZOFRAN) injection 4 mg (4 mg Intravenous Given 09/04/21 0517)  ketorolac (TORADOL) 15 MG/ML injection 7.5 mg (7.5 mg Intravenous Given 09/04/21 0517)  loperamide (IMODIUM) capsule 4 mg (4 mg Oral Given 09/04/21 0516)    4:47 AM The patient states he is too weak to ambulate  without assistance but he has been observed to ambulate into the emergency department as well as from his room to the bathroom and back without difficulty.  Although he has a history of alcohol abuse he does not appear tremulous nor has he been vomiting in the ED so I doubt acute alcohol withdrawal.  6:09 AM Patient feeling better after IV fluids and medications.  He has been able to ambulate to the bathroom and back without assistance.  I do not see any justification for admission to the  hospital at this time.  He is noted to have a low neutrophil count but he does not yet meet neutropenic criteria.  He was advised that if he does worsen over the next 12 hours he should return and we will reconsider admission especially if his white count drops further and/or he has a documented fever of 100.4 or greater.  PROCEDURES  Procedures   ED DIAGNOSES     ICD-10-CM   1. Diarrhea in adult patient  R19.7     2. Dehydration  E86.0     3. Weakness  R53.1          Leeah Politano, Jenny Reichmann, MD 09/04/21 [phone]

## 2021-09-22 ENCOUNTER — Encounter: Payer: Self-pay | Admitting: Medical

## 2021-09-22 ENCOUNTER — Ambulatory Visit (INDEPENDENT_AMBULATORY_CARE_PROVIDER_SITE_OTHER): Payer: Medicare PPO | Admitting: Medical

## 2021-09-22 VITALS — BP 139/80 | HR 79 | Temp 98.2°F | Resp 18 | Ht 67.0 in | Wt 174.6 lb

## 2021-09-22 DIAGNOSIS — K4090 Unilateral inguinal hernia, without obstruction or gangrene, not specified as recurrent: Secondary | ICD-10-CM

## 2021-09-22 DIAGNOSIS — K760 Fatty (change of) liver, not elsewhere classified: Secondary | ICD-10-CM | POA: Diagnosis not present

## 2021-09-22 DIAGNOSIS — I10 Essential (primary) hypertension: Secondary | ICD-10-CM

## 2021-09-22 DIAGNOSIS — K429 Umbilical hernia without obstruction or gangrene: Secondary | ICD-10-CM

## 2021-09-22 DIAGNOSIS — R944 Abnormal results of kidney function studies: Secondary | ICD-10-CM

## 2021-09-22 DIAGNOSIS — R972 Elevated prostate specific antigen [PSA]: Secondary | ICD-10-CM

## 2021-09-22 DIAGNOSIS — N402 Nodular prostate without lower urinary tract symptoms: Secondary | ICD-10-CM | POA: Diagnosis not present

## 2021-09-22 DIAGNOSIS — F17200 Nicotine dependence, unspecified, uncomplicated: Secondary | ICD-10-CM

## 2021-09-22 DIAGNOSIS — I700 Atherosclerosis of aorta: Secondary | ICD-10-CM

## 2021-09-22 DIAGNOSIS — R739 Hyperglycemia, unspecified: Secondary | ICD-10-CM | POA: Diagnosis not present

## 2021-09-22 NOTE — Progress Notes (Signed)
Subjective:    Patient ID: Gerald Mckinney, male    DOB: 04-27-1955, 66 y.o.   MRN: [id number]  HPI   Pt in for first time.  Pt is retired, admits poor diet/describes poor diet, smokes.  On review of labs he has mild elevated sugar, decreased gfr and htn.  Pt also mentions he has rt lower quadrant and umbilical hernia. No fever, no chills and no vomiting.   Prostate nodule 1 mm in size by ct.  Aortic atherosclerosis on recent CT.  Pt states former alcohol abuse but recently cut back significantly. Only rare bear foot wine cooler.       Review of Systems  Constitutional:  Negative for chills, fatigue and fever.  HENT:  Negative for congestion, drooling and ear pain.   Respiratory:  Negative for cough, chest tightness, shortness of breath and wheezing.   Cardiovascular:  Negative for chest pain and palpitations.  Gastrointestinal:  Negative for abdominal pain, constipation, diarrhea and nausea.  Genitourinary:  Negative for dysuria and frequency.  Musculoskeletal:  Negative for back pain, joint swelling, myalgias and neck stiffness.  Skin:  Negative for rash.  Neurological:  Negative for dizziness, seizures, weakness and light-headedness.  Hematological:  Negative for adenopathy. Does not bruise/bleed easily.  Psychiatric/Behavioral:  Negative for behavioral problems, decreased concentration and dysphoric mood. The patient is not nervous/anxious.     Past Medical History:  Diagnosis Date   Alcohol abuse    Hypertension      Social History   Socioeconomic History   Marital status: Married    Spouse name: Not on file   Number of children: Not on file   Years of education: Not on file   Highest education level: Not on file  Occupational History   Not on file  Tobacco Use   Smoking status: Every Day    Packs/day: 1.00    Years: 40.00    Total pack years: 40.00    Types: Cigarettes   Smokeless tobacco: Former  Scientific laboratory technician Use: Never used  Substance  and Sexual Activity   Alcohol use: Yes    Alcohol/week: 1.0 standard drink of alcohol    Types: 1 Glasses of wine per week    Comment: barefoot bottle of wine scattered over a week.   Drug use: Yes    Types: Marijuana    Comment: 3 joints a day.   Sexual activity: Not Currently  Other Topics Concern   Not on file  Social History Narrative   Not on file   Social Determinants of Health   Financial Resource Strain: Not on file  Food Insecurity: Not on file  Transportation Needs: Not on file  Physical Activity: Not on file  Stress: Not on file  Social Connections: Not on file  Intimate Partner Violence: Not on file    History reviewed. No pertinent surgical history.  History reviewed. No pertinent family history.  No Known Allergies  Current Outpatient Medications on File Prior to Visit  Medication Sig Dispense Refill   loperamide (IMODIUM) 2 MG capsule Take 1 capsule (2 mg total) by mouth 4 (four) times daily as needed for diarrhea or loose stools. 12 capsule 0   ondansetron (ZOFRAN-ODT) 8 MG disintegrating tablet Take 1 tablet (8 mg total) by mouth every 8 (eight) hours as needed for nausea or vomiting. 10 tablet 0   No current facility-administered medications on file prior to visit.    BP 139/80   Pulse 79  Temp 98.2 F (36.8 C)   Resp 18   Ht '5\' 7"'$  (1.702 m)   Wt 174 lb 9.6 oz (79.2 kg)   SpO2 92%   BMI 27.35 kg/m        Objective:   Physical Exam  General Mental Status- Alert. General Appearance- Not in acute distress.   Skin General: Color- Normal Color. Moisture- Normal Moisture.  Neck Carotid Arteries- Normal color. Moisture- Normal Moisture. No carotid bruits. No JVD.  Chest and Lung Exam Auscultation: Breath Sounds:-Normal.  Cardiovascular Auscultation:Rythm- Regular. Murmurs & Other Heart Sounds:Auscultation of the heart reveals- No Murmurs.  Abdomen Inspection:-Inspeection Normal. Palpation/Percussion:Note:No mass. Palpation and  Percussion of the abdomen reveal- Non Tender, Non Distended + BS, no rebound or guarding. Rt groin area bulge palpated in inguinal region.   Neurologic Cranial Nerve exam:- CN III-XII intact(No nystagmus), symmetric smile. Strength:- 5/5 equal and symmetric strength both upper and lower extremities.       Assessment & Plan:   Patient Instructions  Borderline bp/htn- will need to follow bp trend and you may need low dose bp med.  For elevated sugar and decreased gfr will get a1c and cmp today.  For aortic atherosclerosis on ct and elevated bp will get lipid panel today.  For fatty liver recommend not drinking any alcohol and avoid fatty foods.  Prostate nodule on ct. Will get psa today.  For direct hernia and umbilical hernia placing referral to surgeon. If you get severe pain or complication signs/symptoms as discussed be seen in ED.  For smoking recommend stopping and placed referral to pulmonologist for screening ct chest lung cancer.   Follow up in one month or sooner if needed   [employer], PA-C

## 2021-09-22 NOTE — Patient Instructions (Addendum)
Borderline bp/htn- will need to follow bp trend and you may need low dose bp med.  For elevated sugar and decreased gfr will get a1c and cmp today.  For aortic atherosclerosis on ct and elevated bp will get lipid panel today.  For fatty liver recommend not drinking any alcohol and avoid fatty foods.  Prostate nodule on ct. Will get psa today.  For direct hernia and umbilical hernia placing referral to surgeon. If you get severe pain or complication signs/symptoms as discussed be seen in ED.  For smoking recommend stopping and placed referral to pulmonologist for screening ct chest lung cancer.   Follow up in one month or sooner if needed

## 2021-09-23 LAB — COMPREHENSIVE METABOLIC PANEL
ALT: 28 U/L (ref 0–53)
AST: 18 U/L (ref 0–37)
Albumin: 3.9 g/dL (ref 3.5–5.2)
Alkaline Phosphatase: 86 U/L (ref 39–117)
BUN: 8 mg/dL (ref 6–23)
CO2: 22 mEq/L (ref 19–32)
Calcium: 9.2 mg/dL (ref 8.4–10.5)
Chloride: 104 mEq/L (ref 96–112)
Creatinine, Ser: 1.06 mg/dL (ref 0.40–1.50)
GFR: 72.5 mL/min (ref >60.00)
Glucose, Bld: 97 mg/dL (ref 70–99)
Potassium: 4.4 mEq/L (ref 3.5–5.1)
Sodium: 138 mEq/L (ref 135–145)
Total Bilirubin: 0.3 mg/dL (ref 0.2–1.2)
Total Protein: 7.2 g/dL (ref 6.0–8.3)

## 2021-09-23 LAB — LIPID PANEL
Cholesterol: 203 mg/dL — ABNORMAL HIGH (ref 0–200)
HDL: 36.2 mg/dL — ABNORMAL LOW (ref >39.00)
NonHDL: 166.73
Total CHOL/HDL Ratio: 6
Triglycerides: 266 mg/dL — ABNORMAL HIGH (ref 0.0–149.0)
VLDL: 53.2 mg/dL — ABNORMAL HIGH (ref 0.0–40.0)

## 2021-09-23 LAB — LDL CHOLESTEROL, DIRECT: Direct LDL: 133 mg/dL

## 2021-09-23 LAB — PSA: PSA: 79.35 ng/mL — ABNORMAL HIGH (ref 0.10–4.00)

## 2021-09-23 LAB — HEMOGLOBIN A1C: Hgb A1c MFr Bld: 7 % — ABNORMAL HIGH (ref 4.6–6.5)

## 2021-09-23 MED ORDER — CIPROFLOXACIN HCL 500 MG PO TABS: 500.0000 mg | ORAL_TABLET | Freq: Two times a day (BID) | ORAL | 0 refills | Status: DC

## 2021-09-23 NOTE — Addendum Note (Signed)
Addended by: Anabel Halon on: 09/23/2021 09:41 PM   Modules accepted: Orders

## 2021-09-24 MED ORDER — ATORVASTATIN CALCIUM 10 MG PO TABS: 10.0000 mg | ORAL_TABLET | Freq: Every day | ORAL | 1 refills | Status: AC

## 2021-09-24 MED ORDER — METFORMIN HCL 500 MG PO TABS: 500.0000 mg | ORAL_TABLET | Freq: Every day | ORAL | 1 refills | Status: AC

## 2021-09-24 NOTE — Addendum Note (Signed)
Addended byDamita Dunnings D on: 09/24/2021 02:57 PM   Modules accepted: Orders

## 2021-10-19 ENCOUNTER — Encounter: Payer: Self-pay | Admitting: Medical

## 2021-10-19 ENCOUNTER — Ambulatory Visit (INDEPENDENT_AMBULATORY_CARE_PROVIDER_SITE_OTHER): Payer: Medicare PPO | Admitting: Medical

## 2021-10-19 VITALS — BP 138/80 | HR 100 | Resp 18 | Ht 67.0 in | Wt 178.2 lb

## 2021-10-19 DIAGNOSIS — I10 Essential (primary) hypertension: Secondary | ICD-10-CM

## 2021-10-19 DIAGNOSIS — K4090 Unilateral inguinal hernia, without obstruction or gangrene, not specified as recurrent: Secondary | ICD-10-CM

## 2021-10-19 DIAGNOSIS — Z1211 Encounter for screening for malignant neoplasm of colon: Secondary | ICD-10-CM

## 2021-10-19 DIAGNOSIS — R972 Elevated prostate specific antigen [PSA]: Secondary | ICD-10-CM

## 2021-10-19 DIAGNOSIS — Z7984 Long term (current) use of oral hypoglycemic drugs: Secondary | ICD-10-CM

## 2021-10-19 DIAGNOSIS — E119 Type 2 diabetes mellitus without complications: Secondary | ICD-10-CM | POA: Diagnosis not present

## 2021-10-19 MED ORDER — LOSARTAN POTASSIUM 25 MG PO TABS: 25.0000 mg | ORAL_TABLET | Freq: Every day | ORAL | 11 refills | Status: AC

## 2021-10-19 MED ORDER — CIPROFLOXACIN HCL 500 MG PO TABS: 500.0000 mg | ORAL_TABLET | Freq: Two times a day (BID) | ORAL | 0 refills | Status: AC

## 2021-10-19 NOTE — Patient Instructions (Addendum)
For high psa and prostate nodule history placed referral to urologist. Pt sister willing to help call specialist offices . Will rx cipro 500 mg # 20 1 tab po bid x 10 days. Will repeat psa in 2 weeks to see if this had impact on psa value.  Sent to [facility], [place], [place] [address] Phone: [phone]   Sent to [facility] for rt inguinal canal hernia. Please call surgeon office. [facility], [place], [place] [address] Phone: [phone]  For diabetes eat low sugar diet and made metformin available to start if you are wiling. Repeat cmp and a1c middle of November.   For high cholseterol did rx atorvatatin.  The 10-year ASCVD risk score (Arnett DK, et al., 2019) is: 37.6%   Values used to calculate the score:     Age: 66 years     Sex: Male     Is Non-Hispanic African American: Yes     Diabetic: Yes     Tobacco smoker: Yes     Systolic Blood Pressure: 469 mmHg     Is BP treated: No     HDL Cholesterol: 36.2 mg/dL     Total Cholesterol: 203 mg/dL   Bp is high for diabetic. Recommend losartan 25 mg daily.  Follow up middle of November or sooner if needed.

## 2021-10-19 NOTE — Progress Notes (Signed)
Subjective:    Patient ID: Gerald Mckinney, male    DOB: Nov 03, 1955, 66 y.o.   MRN: [id number]  HPI  Pt in for follow up.  Pt is diabetic average was in diabetic range. Pt admits was drinking alcohol and eating high sugar foods months preceding labs.pt did not diabetes. Pt states he did not start metformin. He stresses will change diet and exercise.  Pt prostate protein was elevated. I had advised to start cipro and placed referral to urologist. Pt did not start cipro antibiotic. His phone was not working either so urology appt was not made.   High cholesterol- rx'd atorvastatin.  Htn- bp moderate high.   Pt expresses some reluctance on starting meds and attending referrals.      Review of Systems  Constitutional:  Negative for chills, fatigue and fever.  HENT:  Negative for congestion, dental problem, ear discharge and ear pain.   Respiratory:  Negative for cough, chest tightness, shortness of breath and wheezing.   Cardiovascular:  Negative for chest pain and palpitations.  Gastrointestinal:  Negative for abdominal pain, anal bleeding and blood in stool.  Genitourinary:  Negative for hematuria and penile swelling.  Musculoskeletal:  Negative for back pain, joint swelling and neck pain.  Skin:  Negative for rash and wound.  Hematological:  Negative for adenopathy. Does not bruise/bleed easily.   Past Medical History:  Diagnosis Date   Alcohol abuse    Hypertension      Social History   Socioeconomic History   Marital status: Married    Spouse name: Not on file   Number of children: Not on file   Years of education: Not on file   Highest education level: Not on file  Occupational History   Not on file  Tobacco Use   Smoking status: Every Day    Packs/day: 1.00    Years: 40.00    Total pack years: 40.00    Types: Cigarettes   Smokeless tobacco: Former  Scientific laboratory technician Use: Never used  Substance and Sexual Activity   Alcohol use: Yes    Alcohol/week:  1.0 standard drink of alcohol    Types: 1 Glasses of wine per week    Comment: barefoot bottle of wine scattered over a week.   Drug use: Yes    Types: Marijuana    Comment: 3 joints a day.   Sexual activity: Not Currently  Other Topics Concern   Not on file  Social History Narrative   Not on file   Social Determinants of Health   Financial Resource Strain: Not on file  Food Insecurity: Not on file  Transportation Needs: Not on file  Physical Activity: Not on file  Stress: Not on file  Social Connections: Not on file  Intimate Partner Violence: Not on file    No past surgical history on file.  No family history on file.  No Known Allergies  Current Outpatient Medications on File Prior to Visit  Medication Sig Dispense Refill   atorvastatin (LIPITOR) 10 MG tablet Take 1 tablet (10 mg total) by mouth at bedtime. 30 tablet 1   loperamide (IMODIUM) 2 MG capsule Take 1 capsule (2 mg total) by mouth 4 (four) times daily as needed for diarrhea or loose stools. 12 capsule 0   metFORMIN (GLUCOPHAGE) 500 MG tablet Take 1 tablet (500 mg total) by mouth daily with breakfast. 30 tablet 1   ondansetron (ZOFRAN-ODT) 8 MG disintegrating tablet Take 1 tablet (8 mg  total) by mouth every 8 (eight) hours as needed for nausea or vomiting. 10 tablet 0   No current facility-administered medications on file prior to visit.    BP 138/80   Pulse 100   Resp 18   Ht '5\' 7"'$  (1.702 m)   Wt 178 lb 3.2 oz (80.8 kg)   SpO2 100%   BMI 27.91 kg/m        Objective:   Physical Exam  General Mental Status- Alert. General Appearance- Not in acute distress.   Skin General: Color- Normal Color. Moisture- Normal Moisture.  Neck Carotid Arteries- Normal color. Moisture- Normal Moisture. No carotid bruits. No JVD.  Chest and Lung Exam Auscultation: Breath Sounds:-Normal.  Cardiovascular Auscultation:Rythm- Regular. Murmurs & Other Heart Sounds:Auscultation of the heart reveals- No  Murmurs.  Abdomen Inspection:-Inspeection Normal. Palpation/Percussion:Note:No mass. Palpation and Percussion of the abdomen reveal- Non Tender, Non Distended + BS, no rebound or guarding.   Neurologic Cranial Nerve exam:- CN III-XII intact(No nystagmus), symmetric smile. Strength:- 5/5 equal and symmetric strength both upper and lower extremities.       Assessment & Plan:   Patient Instructions  For high psa and prostate nodule history placed referral to urologist. Pt sister willing to help call specialist offices . Will rx cipro 500 mg # 20 1 tab po bid x 10 days. Will repeat psa in 2 weeks to see if this had impact on psa value.  Sent to [facility], [place],  [address] Phone: [phone]   Sent to [facility] for rt inguinal canal hernia. Please call surgeon office. [facility], [place],  [address] Phone: [phone]  For diabetes eat low sugar diet and made metformin available to start if you are wiling. Repeat cmp and a1c middle of November.   For high cholseterol did rx atorvatatin.  The 10-year ASCVD risk score (Arnett DK, et al., 2019) is: 37.6%   Values used to calculate the score:     Age: 17 years     Sex: Male     Is Non-Hispanic African American: Yes     Diabetic: Yes     Tobacco smoker: Yes     Systolic Blood Pressure: 254 mmHg     Is BP treated: No     HDL Cholesterol: 36.2 mg/dL     Total Cholesterol: 203 mg/dL   Bp is high for diabetic. Recommend losartan 25 mg daily.  Follow up middle of November of sooner if needed.

## 2021-10-27 ENCOUNTER — Ambulatory Visit: Payer: Medicare PPO | Admitting: Medical

## 2021-11-02 ENCOUNTER — Telehealth: Payer: Self-pay | Admitting: Medical

## 2021-11-02 NOTE — Telephone Encounter (Signed)
Left message for patient to call back and schedule Medicare Annual Wellness Visit (AWV) .   Please offer to do virtually or by telephone.  Left office number and my jabber [phone].  AWVI eligible as of 12/24/2019   Please schedule at anytime with Nurse Health Advisor.

## 2021-12-16 ENCOUNTER — Other Ambulatory Visit (HOSPITAL_COMMUNITY): Payer: Self-pay | Admitting: Urology

## 2021-12-16 DIAGNOSIS — C61 Malignant neoplasm of prostate: Secondary | ICD-10-CM

## 2021-12-28 ENCOUNTER — Ambulatory Visit: Payer: Medicare PPO | Admitting: Medical

## 2021-12-29 ENCOUNTER — Ambulatory Visit: Payer: Medicare PPO | Admitting: Medical

## 2021-12-30 ENCOUNTER — Other Ambulatory Visit (HOSPITAL_COMMUNITY): Payer: Medicare PPO

## 2021-12-31 ENCOUNTER — Encounter (HOSPITAL_COMMUNITY)
Admission: RE | Admit: 2021-12-31 | Discharge: 2021-12-31 | Disposition: A | Discharge status: Home / Self Care | Payer: Medicare PPO | Source: Ambulatory Visit | Attending: Urology | Admitting: Urology

## 2021-12-31 DIAGNOSIS — C61 Malignant neoplasm of prostate: Secondary | ICD-10-CM | POA: Insufficient documentation

## 2021-12-31 MED ORDER — PIFLIFOLASTAT F 18 (PYLARIFY) INJECTION
9.0000 | Freq: Once | INTRAVENOUS | Status: AC
  Administered 2021-12-31: 8.37 via INTRAVENOUS

## 2022-02-03 ENCOUNTER — Telehealth: Payer: Self-pay | Admitting: Medical

## 2022-02-03 NOTE — Telephone Encounter (Signed)
Copied from [place] [phone]. Topic: Medicare AWV >> Feb 03, 2022  1:11 PM Gillis Santa wrote: Reason for CRM: LVM PATIENT TO CALL [phone] [place]

## 2022-03-30 ENCOUNTER — Telehealth: Payer: Self-pay | Admitting: Medical

## 2022-03-30 NOTE — Telephone Encounter (Signed)
Copied from [place] [phone]. Topic: Medicare AWV >> Mar 30, 2022 11:13 AM Devoria Glassing wrote: Reason for CRM: Left message for patient to schedule Annual Wellness Visit(AWV).  Please schedule with Health Nurse Advisor at [facility]. Please call [phone] ask for [facility].

## 2022-05-28 ENCOUNTER — Telehealth: Payer: Self-pay | Admitting: Pharmacist

## 2022-05-28 NOTE — Telephone Encounter (Signed)
Patient was on 2023 report for low adherence for the following medications - atorvastatin, metformin and losartan.  These medications were prescribed after visit with Ed Saguier, [facility] 09/2021.  Patient has not been seen in office since August 2023 - missed several appointments.   Outreach attempted today to discuss medications and indications. Unable to reach patient but left a VM with CB# [phone] on his phone.  ALso try to contact his emergency contact to leave office number but his sister's number (EC) has a full MB.

## 2022-11-26 ENCOUNTER — Other Ambulatory Visit: Payer: Self-pay | Admitting: Medical

## 2022-11-26 DIAGNOSIS — Z1212 Encounter for screening for malignant neoplasm of rectum: Secondary | ICD-10-CM

## 2022-11-26 DIAGNOSIS — Z1211 Encounter for screening for malignant neoplasm of colon: Secondary | ICD-10-CM

## 2023-02-02 DIAGNOSIS — E663 Overweight: Secondary | ICD-10-CM | POA: Diagnosis not present

## 2023-02-02 DIAGNOSIS — F1099 Alcohol use, unspecified with unspecified alcohol-induced disorder: Secondary | ICD-10-CM | POA: Diagnosis not present

## 2023-02-02 DIAGNOSIS — C61 Malignant neoplasm of prostate: Secondary | ICD-10-CM | POA: Diagnosis not present

## 2023-02-02 DIAGNOSIS — E785 Hyperlipidemia, unspecified: Secondary | ICD-10-CM | POA: Diagnosis not present

## 2023-02-02 DIAGNOSIS — C775 Secondary and unspecified malignant neoplasm of intrapelvic lymph nodes: Secondary | ICD-10-CM | POA: Diagnosis not present

## 2023-02-02 DIAGNOSIS — F1010 Alcohol abuse, uncomplicated: Secondary | ICD-10-CM | POA: Diagnosis not present

## 2023-02-02 DIAGNOSIS — E1151 Type 2 diabetes mellitus with diabetic peripheral angiopathy without gangrene: Secondary | ICD-10-CM | POA: Diagnosis not present

## 2023-02-02 DIAGNOSIS — I700 Atherosclerosis of aorta: Secondary | ICD-10-CM | POA: Diagnosis not present

## 2023-02-02 DIAGNOSIS — I10 Essential (primary) hypertension: Secondary | ICD-10-CM | POA: Diagnosis not present

## 2023-02-09 LAB — COLOGUARD

## 2023-02-09 NOTE — Addendum Note (Signed)
Addended by: Gwenevere Abbot on: 02/09/2023 03:58 PM   Modules accepted: Orders

## 2023-02-18 DIAGNOSIS — Z114 Encounter for screening for human immunodeficiency virus [HIV]: Secondary | ICD-10-CM | POA: Diagnosis not present

## 2023-02-18 DIAGNOSIS — Z125 Encounter for screening for malignant neoplasm of prostate: Secondary | ICD-10-CM | POA: Diagnosis not present

## 2023-02-18 DIAGNOSIS — F1010 Alcohol abuse, uncomplicated: Secondary | ICD-10-CM | POA: Diagnosis not present

## 2023-02-18 DIAGNOSIS — Z79899 Other long term (current) drug therapy: Secondary | ICD-10-CM | POA: Diagnosis not present

## 2023-02-18 DIAGNOSIS — Z0189 Encounter for other specified special examinations: Secondary | ICD-10-CM | POA: Diagnosis not present

## 2023-02-18 DIAGNOSIS — E1151 Type 2 diabetes mellitus with diabetic peripheral angiopathy without gangrene: Secondary | ICD-10-CM | POA: Diagnosis not present

## 2023-02-18 DIAGNOSIS — Z0000 Encounter for general adult medical examination without abnormal findings: Secondary | ICD-10-CM | POA: Diagnosis not present

## 2023-02-18 DIAGNOSIS — C775 Secondary and unspecified malignant neoplasm of intrapelvic lymph nodes: Secondary | ICD-10-CM | POA: Diagnosis not present

## 2023-02-18 DIAGNOSIS — Z1159 Encounter for screening for other viral diseases: Secondary | ICD-10-CM | POA: Diagnosis not present

## 2023-02-18 DIAGNOSIS — Z136 Encounter for screening for cardiovascular disorders: Secondary | ICD-10-CM | POA: Diagnosis not present

## 2023-02-18 DIAGNOSIS — E663 Overweight: Secondary | ICD-10-CM | POA: Diagnosis not present

## 2023-02-18 DIAGNOSIS — C61 Malignant neoplasm of prostate: Secondary | ICD-10-CM | POA: Diagnosis not present

## 2023-02-18 DIAGNOSIS — F1220 Cannabis dependence, uncomplicated: Secondary | ICD-10-CM | POA: Diagnosis not present

## 2023-02-18 DIAGNOSIS — E559 Vitamin D deficiency, unspecified: Secondary | ICD-10-CM | POA: Diagnosis not present

## 2023-04-26 ENCOUNTER — Encounter: Payer: Self-pay | Admitting: Medical
